# Patient Record
Sex: Male | Born: 1995
Health system: Southern US, Community
[De-identification: ages and names within clinical notes are randomized; demographics above are authoritative.]

## PROBLEM LIST (undated history)

## (undated) HISTORY — PX: TONSILLECTOMY AND ADENOIDECTOMY: SUR1326

## (undated) HISTORY — PX: WISDOM TOOTH EXTRACTION: SHX21

---

## 2017-03-18 ENCOUNTER — Encounter (HOSPITAL_BASED_OUTPATIENT_CLINIC_OR_DEPARTMENT_OTHER): Payer: Self-pay | Admitting: *Deleted

## 2017-03-18 ENCOUNTER — Other Ambulatory Visit: Payer: Self-pay

## 2017-03-18 ENCOUNTER — Emergency Department (HOSPITAL_BASED_OUTPATIENT_CLINIC_OR_DEPARTMENT_OTHER)
Admission: EM | Admit: 2017-03-18 | Discharge: 2017-03-18 | Disposition: A | Discharge status: Home / Self Care | Payer: BLUE CROSS/BLUE SHIELD | Attending: Emergency Medicine | Admitting: Emergency Medicine

## 2017-03-18 ENCOUNTER — Emergency Department (HOSPITAL_BASED_OUTPATIENT_CLINIC_OR_DEPARTMENT_OTHER): Payer: BLUE CROSS/BLUE SHIELD

## 2017-03-18 DIAGNOSIS — R0789 Other chest pain: Secondary | ICD-10-CM | POA: Diagnosis not present

## 2017-03-18 DIAGNOSIS — S0990XA Unspecified injury of head, initial encounter: Secondary | ICD-10-CM | POA: Diagnosis present

## 2017-03-18 DIAGNOSIS — Y9389 Activity, other specified: Secondary | ICD-10-CM | POA: Diagnosis not present

## 2017-03-18 DIAGNOSIS — Y998 Other external cause status: Secondary | ICD-10-CM | POA: Diagnosis not present

## 2017-03-18 DIAGNOSIS — R51 Headache: Secondary | ICD-10-CM | POA: Diagnosis not present

## 2017-03-18 DIAGNOSIS — S0101XA Laceration without foreign body of scalp, initial encounter: Secondary | ICD-10-CM | POA: Diagnosis not present

## 2017-03-18 DIAGNOSIS — M791 Myalgia, unspecified site: Secondary | ICD-10-CM | POA: Insufficient documentation

## 2017-03-18 DIAGNOSIS — Y929 Unspecified place or not applicable: Secondary | ICD-10-CM | POA: Diagnosis not present

## 2017-03-18 DIAGNOSIS — M542 Cervicalgia: Secondary | ICD-10-CM | POA: Diagnosis not present

## 2017-03-18 MED ORDER — CYCLOBENZAPRINE HCL 5 MG PO TABS: 5.0000 mg | ORAL_TABLET | Freq: Every evening | ORAL | 0 refills | Status: DC | PRN

## 2017-03-18 MED ORDER — MUPIROCIN CALCIUM 2 % EX CREA: 1.0000 "application " | TOPICAL_CREAM | Freq: Two times a day (BID) | CUTANEOUS | 0 refills | Status: AC

## 2017-03-18 MED ORDER — ACETAMINOPHEN 500 MG PO TABS
1000.0000 mg | ORAL_TABLET | Freq: Once | ORAL | Status: AC
  Administered 2017-03-18: 1000 mg via ORAL
  Filled 2017-03-18: qty 2

## 2017-03-18 MED FILL — MUPIROCIN 2% CREAM: 2 | 10 days supply | Qty: 30 | Fill #0

## 2017-03-18 MED FILL — CYCLOBENZAPRINE 5 MG TABLET: 5 | 10 days supply | Qty: 10 | Fill #0

## 2017-03-18 NOTE — ED Notes (Signed)
Pt teaching provided on medications that may cause drowsiness. Pt instructed not to drive or operate heavy machinery while taking the prescribed medication. Pt verbalized understanding.   
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
## 2017-03-18 NOTE — ED Triage Notes (Signed)
Pt reports neck and left shoulder pain also.

## 2017-03-18 NOTE — Discharge Instructions (Signed)
Take ibuprofen 3 times a day with meals.  Take 800 mg (4 of the over-the-counter pills).  Do not take other anti-inflammatories at the same time (Advil, Motrin, naproxen, Aleve).  You may supplement with Tylenol if you need further pain control. Use the Bactroban cream twice a day to help prevent infection of your head. Wash your hair once daily with soap and water.  Do not scrub, as this will cause further irritation. Use Flexeril at night as needed for muscle stiffness or soreness.  Have caution, as this may make you tired or groggy.  Do not drive or operate heavy machinery while you are taking this medicine.  Do not drink while you are taking this medicine. Use ice or heat if this helps control your symptoms. You will likely have continued muscle stiffness and soreness over the next several days.  If there is no improvement after a week, you may follow-up with urgent care in the ER. Return to the emergency room if you develop vision changes, vomiting, difficulty breathing, numbness, loss of bowel or bladder control, or any new or worsening symptoms.
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
## 2018-09-20 NOTE — Progress Notes (Signed)
Virtual Visit via Video Note  I connected with Craig Perez on 09/21/2018 at  2:30 PM EDT by a video enabled telemedicine application and verified that I am speaking with the correct person using two identifiers.  Location: Patient: Home Provider: In Clinic   I discussed the limitations of evaluation and management by telemedicine and the availability of in person appointments. The patient expressed understanding and agreed to proceed.  History of Present Illness: [name] is connecting with WebEx today to establish as a new pt.  He is a pleasant 23 year old male. PMH: Increase in anxiety r/t stress of being furloughed from one job 8 weeks ago, his current position is only temporary, he is engaged- trying to plan a wedding, and COVID-19 pandemic. He denies racing thoughts, panic attacks, or insomnia He denies personal hx of depression/anxiety, however reports family hx of mental illness, specifically GAD and persistent depression. He has never been on medication or worked with a Paramedictherapist. He denies SI/HI He lives with his fiance and reports a strong support system of friends and family. He has not had a complete physical since 2013. He estimates to drink >gallon of water/day. He follows heart healthy diet He denies tobacco/vape use, drinks 2-3 cocktails/day. He performs yard/house work, walks daily, and when gyms are open- periodically weight trains  Patient Care Team    Relationship Specialty Notifications Start End  , Jinny BlossomKaty D, NP PCP - General Family Medicine  09/19/18     There are no active problems to display for this patient.    History reviewed. No pertinent past medical history.   Past Surgical History:  Procedure Laterality Date  . TONSILLECTOMY AND ADENOIDECTOMY    . WISDOM TOOTH EXTRACTION       Family History  Problem Relation Age of Onset  . Healthy Mother   . Healthy Father   . Cerebral palsy Brother   . Cerebral palsy Maternal Grandmother   .  Cerebral palsy Maternal Grandfather   . Multiple sclerosis Paternal Grandmother   . Stroke Paternal Grandfather   . Alzheimer's disease Paternal Grandfather      Social History   Substance and Sexual Activity  Drug Use Never     Social History   Substance and Sexual Activity  Alcohol Use Yes  . Alcohol/week: 12.0 standard drinks  . Types: 12 Cans of beer per week     Social History   Tobacco Use  Smoking Status Never Smoker  Smokeless Tobacco Never Used     Outpatient Encounter Medications as of 09/21/2018  Medication Sig  . vitamin C (ASCORBIC ACID) 500 MG tablet Take 500 mg by mouth daily.  . [DISCONTINUED] cyclobenzaprine (FLEXERIL) 5 MG tablet Take 1 tablet (5 mg total) at bedtime as needed by mouth for muscle spasms.   No facility-administered encounter medications on file as of 09/21/2018.     Allergies: Patient has no known allergies.  There is no height or weight on file to calculate BMI.  There were no vitals taken for this visit. Review of Systems: General:   Denies fever, chills, unexplained weight loss.  Optho/Auditory:   Denies visual changes, blurred vision/LOV Respiratory:   Denies SOB, DOE more than baseline levels.  Cardiovascular:   Denies chest pain, palpitations, new onset peripheral edema  Gastrointestinal:   Denies nausea, vomiting, diarrhea.  Genitourinary: Denies dysuria, freq/ urgency, flank pain or discharge from genitals.  Endocrine:     Denies hot or cold intolerance, polyuria, polydipsia. Musculoskeletal:  Denies unexplained myalgias, joint swelling, unexplained arthralgias, gait problems.  Skin:  Denies rash, suspicious lesions Neurological:     Denies dizziness, unexplained weakness, numbness  Psychiatric/Behavioral:   Increase in anxiety +,denies suicidal or homicidal ideations, hallucinations This patient does not have sx concerning for COVID-19 Infection (ie; fever, chills, cough, new or worsening shortness of  breath).  Observations/Objective: No acute distress noted during WebEx video conference  Assessment and Plan: Remain well hydrated, follow heart healthy diet and continue regular exercise If anxiety does not reduce in the next month, will consider starting on Fluoxetine.  COVID-19 Education: Signs and symptoms of COVID-19 infection were discussed with pt and how to seek care for testing.  The importance of following the Stay at Home order, and when out- Social Distancing and wearing a facial mask were discussed today. Follow Up Instructions: 4 weeks CPE, fasting labs the week prior.   I discussed the assessment and treatment plan with the patient. The patient was provided an opportunity to ask questions and all were answered. The patient agreed with the plan and demonstrated an understanding of the instructions.   The patient was advised to call back or seek an in-person evaluation if the symptoms worsen or if the condition fails to improve as anticipated.  I provided 25 minutes of non-face-to-face time during this encounter.   [name], NP

## 2018-09-21 ENCOUNTER — Encounter: Payer: Self-pay | Admitting: Adult Health

## 2018-09-21 ENCOUNTER — Other Ambulatory Visit: Payer: Self-pay

## 2018-09-21 ENCOUNTER — Ambulatory Visit: Payer: BLUE CROSS/BLUE SHIELD | Admitting: Adult Health

## 2018-09-21 DIAGNOSIS — F419 Anxiety disorder, unspecified: Secondary | ICD-10-CM | POA: Insufficient documentation

## 2018-09-21 DIAGNOSIS — Z0000 Encounter for general adult medical examination without abnormal findings: Secondary | ICD-10-CM

## 2018-09-21 NOTE — Assessment & Plan Note (Signed)
If anxiety does not reduce in the next month, will consider starting on Fluoxetine.

## 2018-09-21 NOTE — Assessment & Plan Note (Signed)
  Assessment and Plan: Remain well hydrated, follow heart healthy diet and continue regular exercise If anxiety does not reduce in the next month, will consider starting on Fluoxetine.  COVID-19 Education: Signs and symptoms of COVID-19 infection were discussed with pt and how to seek care for testing.  The importance of following the Stay at Home order, and when out- Social Distancing and wearing a facial mask were discussed today. Follow Up Instructions: 4 weeks CPE, fasting labs the week prior.   I discussed the assessment and treatment plan with the patient. The patient was provided an opportunity to ask questions and all were answered. The patient agreed with the plan and demonstrated an understanding of the instructions.   The patient was advised to call back or seek an in-person evaluation if the symptoms worsen or if the condition fails to improve as anticipated.

## 2018-10-17 ENCOUNTER — Other Ambulatory Visit (INDEPENDENT_AMBULATORY_CARE_PROVIDER_SITE_OTHER): Payer: BLUE CROSS/BLUE SHIELD

## 2018-10-17 ENCOUNTER — Other Ambulatory Visit: Payer: Self-pay

## 2018-10-17 DIAGNOSIS — Z0000 Encounter for general adult medical examination without abnormal findings: Secondary | ICD-10-CM

## 2018-10-17 NOTE — Addendum Note (Signed)
Addended by: Lanier Prude D on: 10/17/2018 08:10 AM   Modules accepted: Level of Service

## 2018-10-18 LAB — TSH: TSH: 6.15 u[IU]/mL — ABNORMAL HIGH (ref 0.450–4.500)

## 2018-10-18 LAB — COMPREHENSIVE METABOLIC PANEL
ALT: 28 IU/L (ref 0–44)
AST: 29 IU/L (ref 0–40)
Albumin/Globulin Ratio: 3.9 — ABNORMAL HIGH (ref 1.2–2.2)
Albumin: 5.4 g/dL — ABNORMAL HIGH (ref 4.1–5.2)
Alkaline Phosphatase: 45 IU/L (ref 39–117)
BUN/Creatinine Ratio: 13 (ref 9–20)
BUN: 11 mg/dL (ref 6–20)
Bilirubin Total: 0.7 mg/dL (ref 0.0–1.2)
CO2: 25 mmol/L (ref 20–29)
Calcium: 10.4 mg/dL — ABNORMAL HIGH (ref 8.7–10.2)
Chloride: 100 mmol/L (ref 96–106)
Creatinine, Ser: 0.88 mg/dL (ref 0.76–1.27)
GFR calc Af Amer: 139 mL/min/{1.73_m2} (ref >59)
GFR calc non Af Amer: 120 mL/min/{1.73_m2} (ref >59)
Globulin, Total: 1.4 g/dL — ABNORMAL LOW (ref 1.5–4.5)
Glucose: 95 mg/dL (ref 65–99)
Potassium: 5.1 mmol/L (ref 3.5–5.2)
Sodium: 139 mmol/L (ref 134–144)
Total Protein: 6.8 g/dL (ref 6.0–8.5)

## 2018-10-18 LAB — LIPID PANEL
Chol/HDL Ratio: 2.3 ratio (ref 0.0–5.0)
Cholesterol, Total: 212 mg/dL — ABNORMAL HIGH (ref 100–199)
HDL: 92 mg/dL (ref >39)
LDL Calculated: 98 mg/dL (ref 0–99)
Triglycerides: 110 mg/dL (ref 0–149)
VLDL Cholesterol Cal: 22 mg/dL (ref 5–40)

## 2018-10-18 LAB — CBC WITH DIFFERENTIAL/PLATELET
Basophils Absolute: 0.1 10*3/uL (ref 0.0–0.2)
Basos: 1 %
EOS (ABSOLUTE): 0.3 10*3/uL (ref 0.0–0.4)
Eos: 5 %
Hematocrit: 43.7 % (ref 37.5–51.0)
Hemoglobin: 14.8 g/dL (ref 13.0–17.7)
Immature Grans (Abs): 0 10*3/uL (ref 0.0–0.1)
Immature Granulocytes: 0 %
Lymphocytes Absolute: 1.8 10*3/uL (ref 0.7–3.1)
Lymphs: 31 %
MCH: 33.2 pg — ABNORMAL HIGH (ref 26.6–33.0)
MCHC: 33.9 g/dL (ref 31.5–35.7)
MCV: 98 fL — ABNORMAL HIGH (ref 79–97)
Monocytes Absolute: 0.5 10*3/uL (ref 0.1–0.9)
Monocytes: 8 %
Neutrophils Absolute: 3.2 10*3/uL (ref 1.4–7.0)
Neutrophils: 55 %
Platelets: 183 10*3/uL (ref 150–450)
RBC: 4.46 x10E6/uL (ref 4.14–5.80)
RDW: 12.2 % (ref 11.6–15.4)
WBC: 5.8 10*3/uL (ref 3.4–10.8)

## 2018-10-18 LAB — HEMOGLOBIN A1C
Est. average glucose Bld gHb Est-mCnc: 100 mg/dL
Hgb A1c MFr Bld: 5.1 % (ref 4.8–5.6)

## 2018-10-20 ENCOUNTER — Other Ambulatory Visit: Payer: BLUE CROSS/BLUE SHIELD

## 2018-10-21 LAB — T3, FREE: T3, Free: 3.4 pg/mL (ref 2.0–4.4)

## 2018-10-21 LAB — SPECIMEN STATUS REPORT

## 2018-10-21 LAB — T4, FREE: Free T4: 1.11 ng/dL (ref 0.82–1.77)

## 2018-10-24 NOTE — Progress Notes (Signed)
Subjective:    Patient ID: Craig Perez, male    DOB: [birth year], 23 y.o.   MRN: [id number]  HPI:  [name] is here for CPE He has been following paleo diet and drinking >90 oz water/day. He performs yard work most days of the week. He denies acute issues today He denies excessive fatigue, reports sound sleep. He continues to abstain from tobacco/vape/excessive ETOH use He denies known exposure to COVID-19  10/17/2018 Labs CBC-stable CMP-stable Aic-WNL, 5.1 Lipid Panel- Tot-212 TGs- 110 HDL-92 LDL-98 TSH-slightly elevated  Free T4- WNL  T3- WNL  Subclinical Hypothyroidism   Healthcare Maintenance: Immunizations-Tdap updated today   Patient Care Team    Relationship Specialty Notifications Start End  Stark Aguinaga, Jinny BlossomKaty D, NP PCP - General Family Medicine  09/19/18     Patient Active Problem List   Diagnosis Date Noted  . Healthcare maintenance 09/21/2018  . Anxiety 09/21/2018     History reviewed. No pertinent past medical history.   Past Surgical History:  Procedure Laterality Date  . TONSILLECTOMY AND ADENOIDECTOMY    . WISDOM TOOTH EXTRACTION       Family History  Problem Relation Age of Onset  . Healthy Mother   . Healthy Father   . Cerebral palsy Brother   . Cerebral palsy Maternal Grandmother   . Cerebral palsy Maternal Grandfather   . Multiple sclerosis Paternal Grandmother   . Stroke Paternal Grandfather   . Alzheimer's disease Paternal Grandfather      Social History   Substance and Sexual Activity  Drug Use Never     Social History   Substance and Sexual Activity  Alcohol Use Yes  . Alcohol/week: 12.0 standard drinks  . Types: 12 Cans of beer per week     Social History   Tobacco Use  Smoking Status Never Smoker  Smokeless Tobacco Never Used     Outpatient Encounter Medications as of 10/26/2018  Medication Sig  . vitamin C (ASCORBIC ACID) 500 MG tablet Take 500 mg by mouth daily.   No facility-administered encounter  medications on file as of 10/26/2018.     Allergies: Patient has no known allergies.  Body mass index is 28.88 kg/m.  Blood pressure 129/77, pulse 63, temperature 98.7 F (37.1 C), temperature source Oral, height 5' 9.5" (1.765 m), weight 198 lb 6.4 oz (90 kg), SpO2 99 %.  Review of Systems  Constitutional: Negative for activity change, appetite change, chills, diaphoresis, fatigue, fever and unexpected weight change.  HENT: Negative for congestion.   Eyes: Negative for visual disturbance.  Respiratory: Negative for cough, chest tightness, shortness of breath, wheezing and stridor.   Cardiovascular: Negative for chest pain, palpitations and leg swelling.  Gastrointestinal: Negative for abdominal distention, abdominal pain, blood in stool, constipation, diarrhea, nausea and vomiting.  Endocrine: Negative for cold intolerance, heat intolerance, polydipsia, polyphagia and polyuria.  Genitourinary: Negative for difficulty urinating, enuresis, flank pain and hematuria.  Musculoskeletal: Negative for arthralgias, back pain, gait problem, joint swelling, myalgias, neck pain and neck stiffness.  Skin: Negative for color change, pallor, rash and wound.  Allergic/Immunologic: Negative for immunocompromised state.  Neurological: Negative for dizziness and headaches.  Hematological: Negative for adenopathy. Does not bruise/bleed easily.  Psychiatric/Behavioral: Negative for behavioral problems, confusion, decreased concentration, dysphoric mood, hallucinations, self-injury, sleep disturbance and suicidal ideas. The patient is not nervous/anxious and is not hyperactive.        Objective:   Physical Exam Vitals signs and nursing note reviewed.  Constitutional:  General: He is not in acute distress.    Appearance: He is normal weight. He is not ill-appearing, toxic-appearing or diaphoretic.  HENT:     Head: Normocephalic and atraumatic.     Right Ear: Tympanic membrane, ear canal and  external ear normal. There is no impacted cerumen.     Left Ear: Tympanic membrane, ear canal and external ear normal. There is no impacted cerumen.     Nose: Nose normal. No congestion.     Mouth/Throat:     Mouth: Mucous membranes are moist.     Pharynx: No oropharyngeal exudate.  Eyes:     Extraocular Movements: Extraocular movements intact.     Conjunctiva/sclera: Conjunctivae normal.     Pupils: Pupils are equal, round, and reactive to light.  Neck:     Musculoskeletal: Normal range of motion. No muscular tenderness.  Cardiovascular:     Rate and Rhythm: Normal rate.     Pulses: Normal pulses.     Heart sounds: Normal heart sounds. No murmur. No friction rub. No gallop.   Pulmonary:     Effort: Pulmonary effort is normal. No respiratory distress.     Breath sounds: Normal breath sounds. No stridor. No wheezing, rhonchi or rales.  Chest:     Chest wall: No tenderness.  Abdominal:     General: Abdomen is flat. Bowel sounds are normal. There is no distension.     Palpations: There is no mass.     Tenderness: There is no abdominal tenderness. There is no right CVA tenderness, left CVA tenderness, guarding or rebound.     Hernia: No hernia is present.  Musculoskeletal: Normal range of motion.        General: No tenderness.  Lymphadenopathy:     Cervical: No cervical adenopathy.  Skin:    General: Skin is warm and dry.     Capillary Refill: Capillary refill takes less than 2 seconds.     Coloration: Skin is not pale.     Findings: No erythema.  Neurological:     Mental Status: He is alert and oriented to person, place, and time.     Coordination: Coordination normal.     Deep Tendon Reflexes: Reflexes normal.  Psychiatric:        Mood and Affect: Mood normal.        Behavior: Behavior normal.        Thought Content: Thought content normal.        Judgment: Judgment normal.       Assessment & Plan:   1. Need for Tdap vaccination   2. Healthcare maintenance      Healthcare maintenance Your blood pressure, vision, and weight- all great. Your labs look good, with only one small elevation in total cholesterol-212 (normal <199). Recommend you reduce your saturated diet by at least 25% and this will help normalize your level. Continue to drink plenty of a water and abstain from tobacco/vape use. Continue to social distance and wear a mask when in public. Recommend annual physical, fasting labs the week prior.    FOLLOW-UP:  Return in about 1 year (around 10/26/2019) for Fasting Labs, CPE.

## 2018-10-26 ENCOUNTER — Other Ambulatory Visit: Payer: Self-pay

## 2018-10-26 ENCOUNTER — Ambulatory Visit (INDEPENDENT_AMBULATORY_CARE_PROVIDER_SITE_OTHER): Payer: BLUE CROSS/BLUE SHIELD | Admitting: Adult Health

## 2018-10-26 ENCOUNTER — Encounter: Payer: Self-pay | Admitting: Adult Health

## 2018-10-26 VITALS — BP 129/77 | HR 63 | Temp 98.7°F | Ht 69.5 in | Wt 198.4 lb

## 2018-10-26 DIAGNOSIS — Z0000 Encounter for general adult medical examination without abnormal findings: Secondary | ICD-10-CM

## 2018-10-26 DIAGNOSIS — Z23 Encounter for immunization: Secondary | ICD-10-CM

## 2018-10-26 NOTE — Patient Instructions (Addendum)

## 2018-10-26 NOTE — Assessment & Plan Note (Signed)
Your blood pressure, vision, and weight- all great. Your labs look good, with only one small elevation in total cholesterol-212 (normal <199). Recommend you reduce your saturated diet by at least 25% and this will help normalize your level. Continue to drink plenty of a water and abstain from tobacco/vape use. Continue to social distance and wear a mask when in public. Recommend annual physical, fasting labs the week prior.
